# Patient Record
Sex: Female | Born: 1935 | Race: White | Hispanic: No
Health system: Southern US, Community
[De-identification: ages and names within clinical notes are randomized; demographics above are authoritative.]

---

## 2004-10-01 ENCOUNTER — Ambulatory Visit: Payer: Self-pay | Admitting: Internal Medicine

## 2005-03-30 ENCOUNTER — Other Ambulatory Visit: Payer: Self-pay

## 2005-04-01 ENCOUNTER — Inpatient Hospital Stay: Payer: Self-pay | Admitting: Internal Medicine

## 2005-10-13 ENCOUNTER — Ambulatory Visit: Payer: Self-pay | Admitting: Internal Medicine

## 2006-07-08 ENCOUNTER — Ambulatory Visit: Payer: Self-pay | Admitting: Neurology

## 2006-12-05 ENCOUNTER — Ambulatory Visit: Payer: Self-pay | Admitting: Internal Medicine

## 2007-01-19 IMAGING — CT CT HEAD WITHOUT AND WITH CONTRAST
1 of 2 series · 13 of 30 positions shown, 17 images · non-contrast
Comparison: none

REASON FOR EXAM: Memory loss
COMMENTS:

[Series 2: without · axial · non-contrast · 0.44mm/px · z∈[-169,-44]mm · 13 of 31 slices shown, 17 images]
[im 3/31  brain]
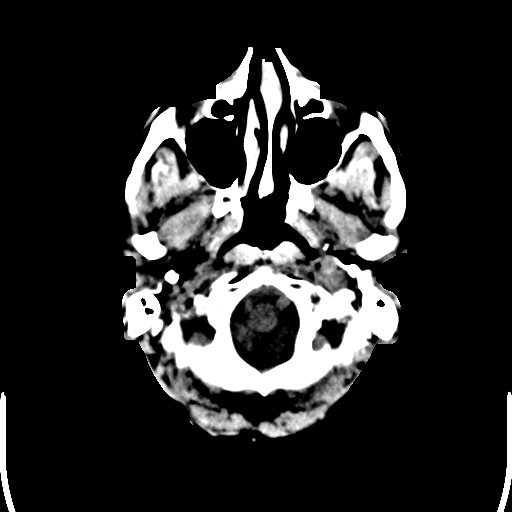
[im 3/31  bone]
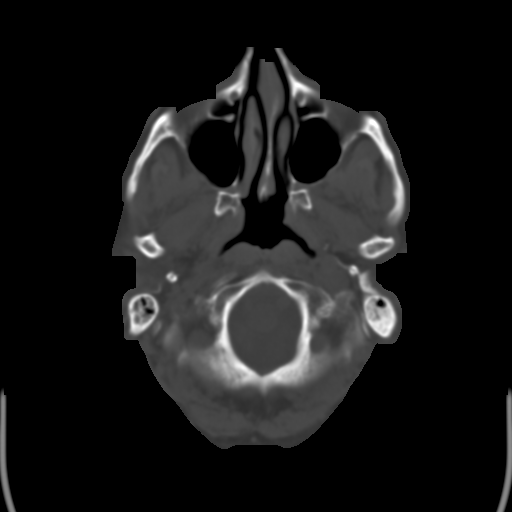
[im 5/31  brain]
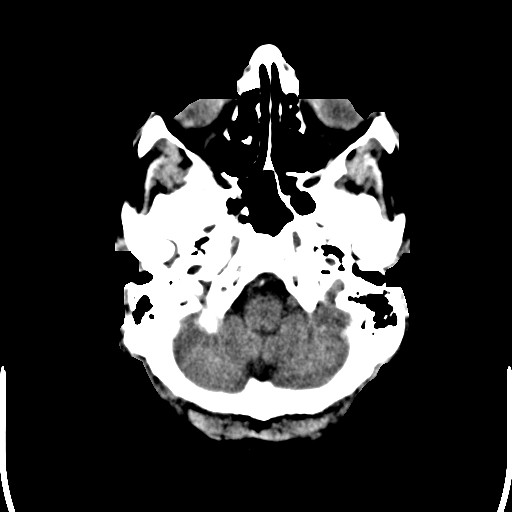
[im 7/31  brain]
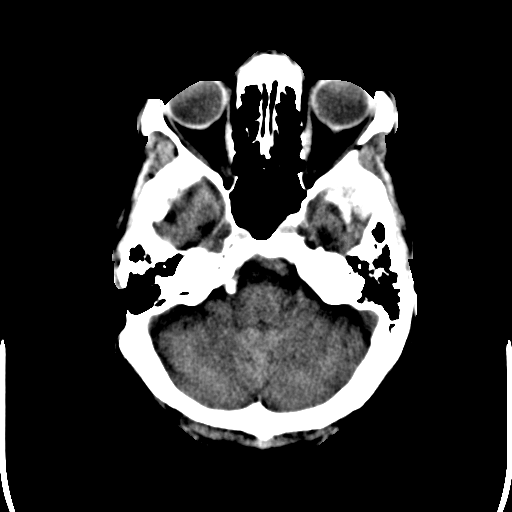
[im 9/31  brain]
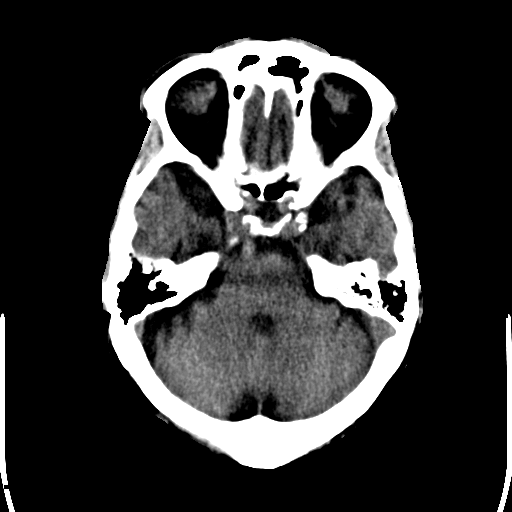
[im 11/31  brain]
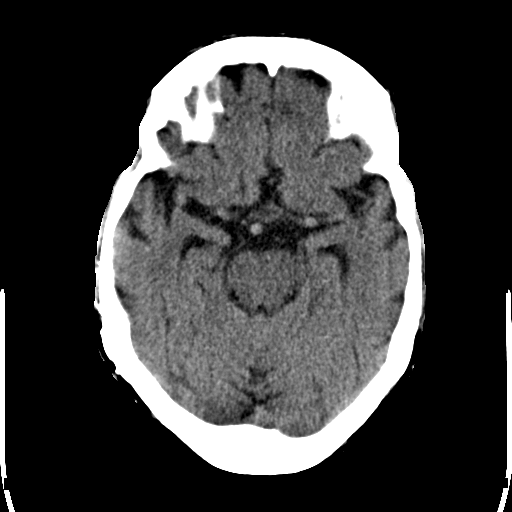
[im 11/31  bone]
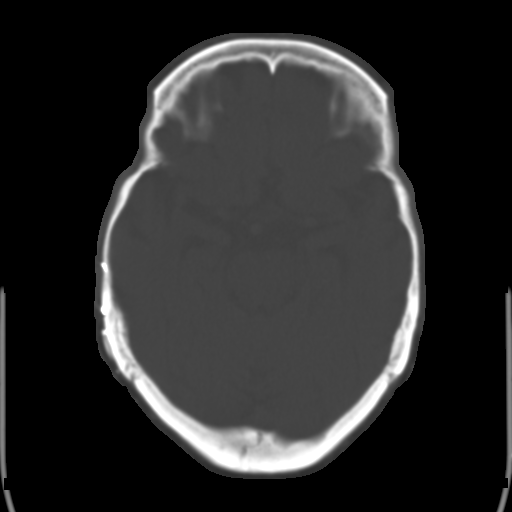
[im 13/31  brain]
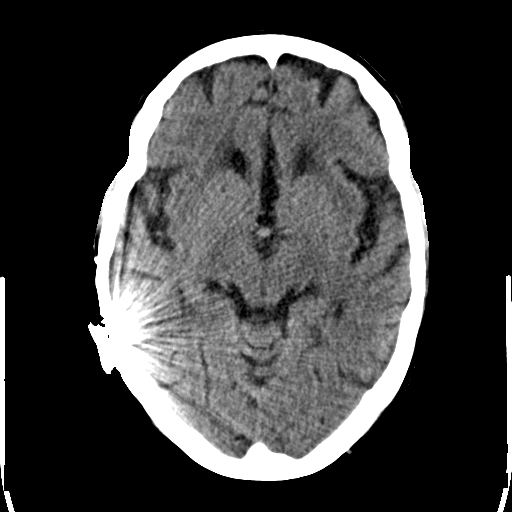
[im 16/31  brain]
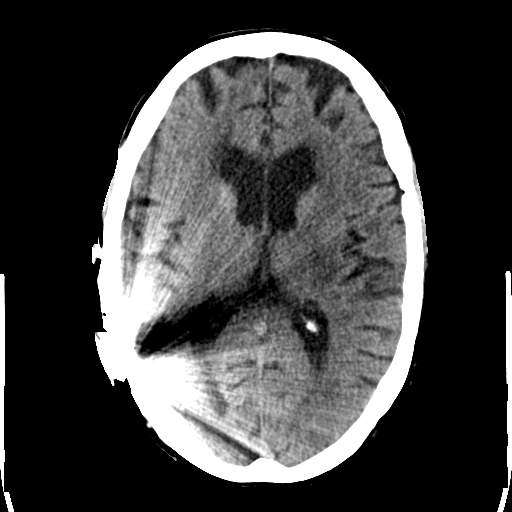
[im 18/31  brain]
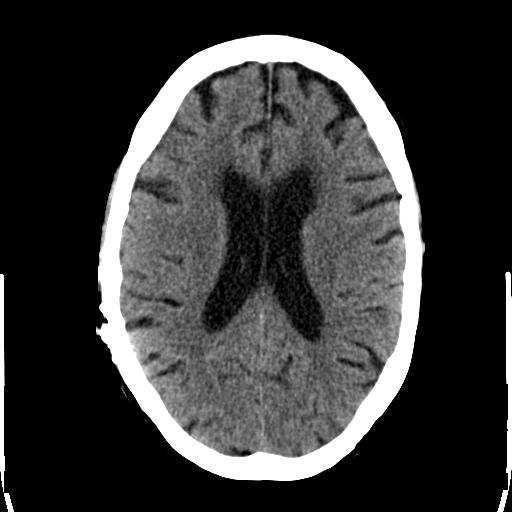
[im 20/31  brain]
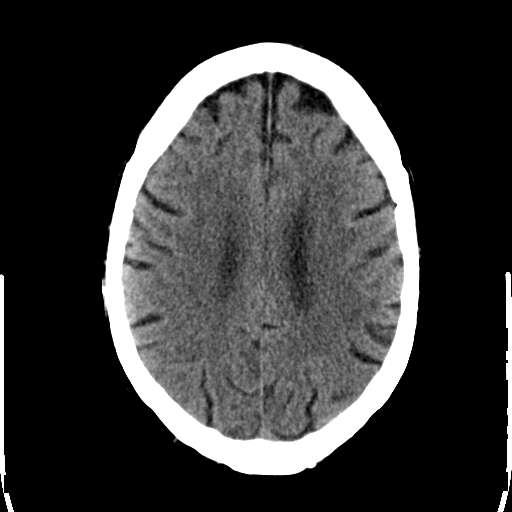
[im 20/31  bone]
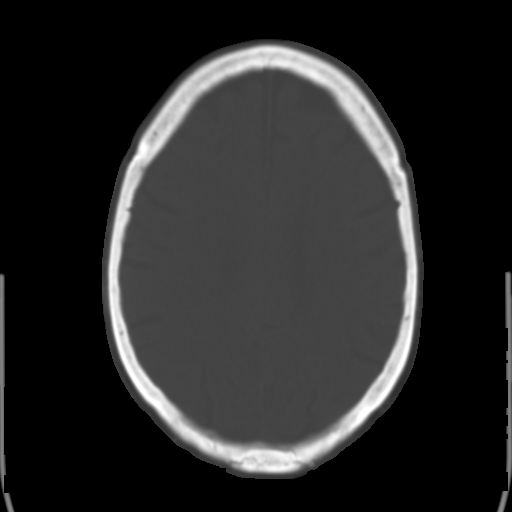
[im 22/31  brain]
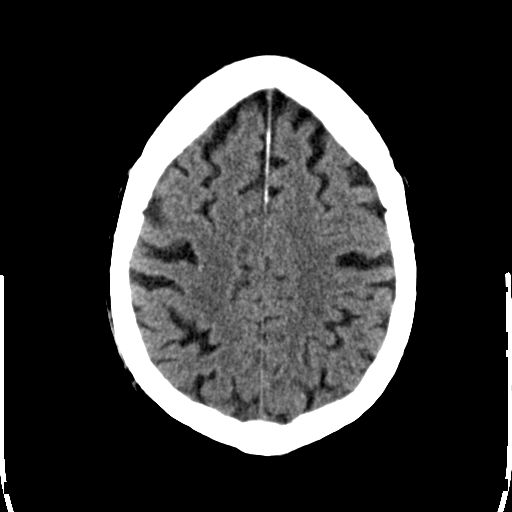
[im 24/31  brain]
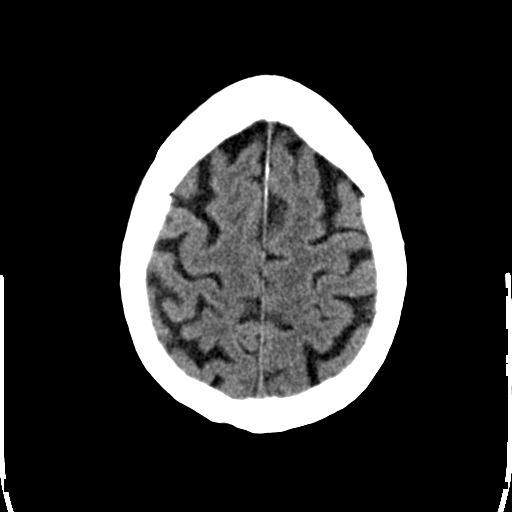
[im 26/31  brain]
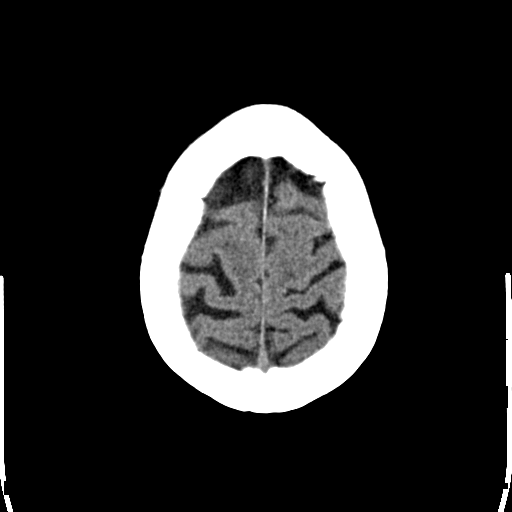
[im 28/31  brain]
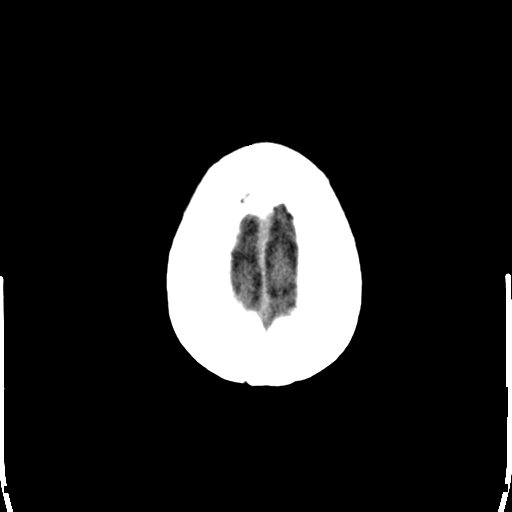
[im 28/31  bone]
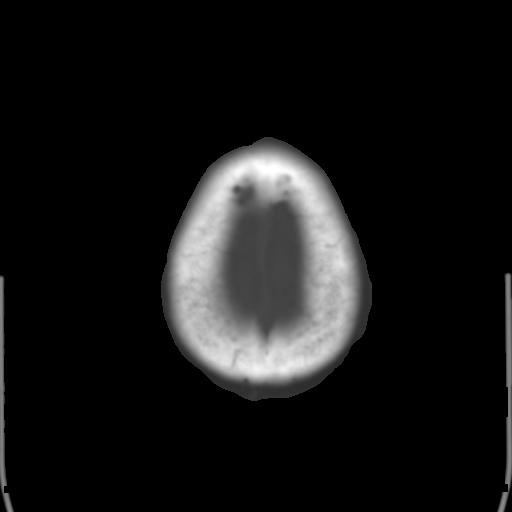

[13 of 30 positions shown; findings below may reference images not displayed]

PROCEDURE:     CT  - CT HEAD W/WO  - July 08, 2006  [DATE]

RESULT:     Pre and post contrast CT scan of the brain is performed.  The
patient has a cochlear implant and a history of breast cancer.  The patient
has a prior head CT from 07/09/03 for comparison.  This is not available on
the PACS system and the films are in remote storage.  They can be compared
if desired.

There is artifact from the cochlear implant over the RIGHT side of the
skull.  The ventricles and sulci appear to be within normal limits.  There
is no evidence of hemorrhage.  There is no mass effect or midline shift.
There is no extraaxial hemorrhage evident.  Posterior fossa appears to be
unremarkable.  The midbrain and brainstem are within normal limits.  There
appears to be some ill-defined low density in the periventricular white
matter most consistent with chronic microvascular ischemic disease.  No
definite territorial infarct is present.

Following the intravenous administration of iodinated contrast there is no
evidence of an enhancing mass or abnormal enhancement.  Bone window images
show no focal lesion.
IMPRESSION: Cochlear implant device over the RIGHT side of the scalp with wires seen
over the cochlear region and through the mastoid air cells.

Changes of diffuse atrophy and chronic microvascular ischemic disease.  No
focal masses.  No definite infarct seen.

## 2007-06-27 ENCOUNTER — Other Ambulatory Visit: Payer: Self-pay

## 2007-06-27 ENCOUNTER — Observation Stay: Payer: Self-pay | Admitting: Internal Medicine

## 2007-06-29 ENCOUNTER — Inpatient Hospital Stay: Payer: Self-pay | Admitting: Internal Medicine

## 2008-01-10 ENCOUNTER — Ambulatory Visit: Payer: Self-pay | Admitting: Internal Medicine

## 2009-01-10 ENCOUNTER — Ambulatory Visit: Payer: Self-pay | Admitting: Internal Medicine

## 2009-01-16 ENCOUNTER — Ambulatory Visit: Payer: Self-pay | Admitting: Internal Medicine

## 2009-07-21 ENCOUNTER — Ambulatory Visit: Payer: Self-pay | Admitting: Internal Medicine

## 2010-10-27 ENCOUNTER — Ambulatory Visit: Payer: Self-pay

## 2011-04-08 DIAGNOSIS — F411 Generalized anxiety disorder: Secondary | ICD-10-CM

## 2011-04-08 DIAGNOSIS — I10 Essential (primary) hypertension: Secondary | ICD-10-CM

## 2011-04-08 DIAGNOSIS — F0280 Dementia in other diseases classified elsewhere without behavioral disturbance: Secondary | ICD-10-CM

## 2011-04-08 DIAGNOSIS — G309 Alzheimer's disease, unspecified: Secondary | ICD-10-CM

## 2011-04-08 DIAGNOSIS — D649 Anemia, unspecified: Secondary | ICD-10-CM

## 2011-05-20 DIAGNOSIS — I10 Essential (primary) hypertension: Secondary | ICD-10-CM

## 2011-05-20 DIAGNOSIS — G309 Alzheimer's disease, unspecified: Secondary | ICD-10-CM

## 2011-05-20 DIAGNOSIS — F0280 Dementia in other diseases classified elsewhere without behavioral disturbance: Secondary | ICD-10-CM

## 2011-05-20 DIAGNOSIS — D649 Anemia, unspecified: Secondary | ICD-10-CM

## 2011-05-20 DIAGNOSIS — F411 Generalized anxiety disorder: Secondary | ICD-10-CM

## 2011-06-17 DIAGNOSIS — D649 Anemia, unspecified: Secondary | ICD-10-CM

## 2011-06-17 DIAGNOSIS — G309 Alzheimer's disease, unspecified: Secondary | ICD-10-CM

## 2011-06-17 DIAGNOSIS — F0280 Dementia in other diseases classified elsewhere without behavioral disturbance: Secondary | ICD-10-CM

## 2011-06-17 DIAGNOSIS — I10 Essential (primary) hypertension: Secondary | ICD-10-CM

## 2011-06-17 DIAGNOSIS — F411 Generalized anxiety disorder: Secondary | ICD-10-CM

## 2011-07-08 DIAGNOSIS — D649 Anemia, unspecified: Secondary | ICD-10-CM

## 2011-07-08 DIAGNOSIS — I10 Essential (primary) hypertension: Secondary | ICD-10-CM

## 2011-07-08 DIAGNOSIS — G309 Alzheimer's disease, unspecified: Secondary | ICD-10-CM

## 2011-07-08 DIAGNOSIS — F411 Generalized anxiety disorder: Secondary | ICD-10-CM

## 2011-07-08 DIAGNOSIS — F0280 Dementia in other diseases classified elsewhere without behavioral disturbance: Secondary | ICD-10-CM

## 2011-07-14 ENCOUNTER — Ambulatory Visit: Payer: Self-pay | Admitting: Internal Medicine

## 2011-09-13 DIAGNOSIS — F411 Generalized anxiety disorder: Secondary | ICD-10-CM

## 2011-09-13 DIAGNOSIS — F0280 Dementia in other diseases classified elsewhere without behavioral disturbance: Secondary | ICD-10-CM

## 2011-09-13 DIAGNOSIS — D649 Anemia, unspecified: Secondary | ICD-10-CM

## 2011-09-13 DIAGNOSIS — I10 Essential (primary) hypertension: Secondary | ICD-10-CM

## 2011-09-13 DIAGNOSIS — G309 Alzheimer's disease, unspecified: Secondary | ICD-10-CM

## 2011-11-04 DIAGNOSIS — G309 Alzheimer's disease, unspecified: Secondary | ICD-10-CM

## 2011-11-04 DIAGNOSIS — F411 Generalized anxiety disorder: Secondary | ICD-10-CM

## 2011-11-04 DIAGNOSIS — D649 Anemia, unspecified: Secondary | ICD-10-CM

## 2011-11-04 DIAGNOSIS — F0280 Dementia in other diseases classified elsewhere without behavioral disturbance: Secondary | ICD-10-CM

## 2011-11-04 DIAGNOSIS — I10 Essential (primary) hypertension: Secondary | ICD-10-CM

## 2011-11-24 DIAGNOSIS — R609 Edema, unspecified: Secondary | ICD-10-CM

## 2011-11-25 ENCOUNTER — Telehealth: Payer: Self-pay | Admitting: *Deleted

## 2011-11-25 ENCOUNTER — Ambulatory Visit: Payer: Self-pay | Admitting: Internal Medicine

## 2011-11-25 NOTE — Telephone Encounter (Signed)
Call Report:  Ultrasound of left leg negative for DVT, patient was sent home.

## 2011-11-25 NOTE — Telephone Encounter (Signed)
Darl Pikes RN at memory care notified

## 2012-01-03 DIAGNOSIS — M159 Polyosteoarthritis, unspecified: Secondary | ICD-10-CM

## 2012-01-03 DIAGNOSIS — F0280 Dementia in other diseases classified elsewhere without behavioral disturbance: Secondary | ICD-10-CM

## 2012-01-03 DIAGNOSIS — I10 Essential (primary) hypertension: Secondary | ICD-10-CM

## 2012-01-03 DIAGNOSIS — F411 Generalized anxiety disorder: Secondary | ICD-10-CM

## 2012-01-03 DIAGNOSIS — G309 Alzheimer's disease, unspecified: Secondary | ICD-10-CM

## 2012-03-08 DIAGNOSIS — F329 Major depressive disorder, single episode, unspecified: Secondary | ICD-10-CM

## 2012-03-16 DIAGNOSIS — M159 Polyosteoarthritis, unspecified: Secondary | ICD-10-CM

## 2012-03-16 DIAGNOSIS — F0280 Dementia in other diseases classified elsewhere without behavioral disturbance: Secondary | ICD-10-CM

## 2012-03-16 DIAGNOSIS — I10 Essential (primary) hypertension: Secondary | ICD-10-CM

## 2012-03-16 DIAGNOSIS — G309 Alzheimer's disease, unspecified: Secondary | ICD-10-CM

## 2012-03-16 DIAGNOSIS — F411 Generalized anxiety disorder: Secondary | ICD-10-CM

## 2012-05-15 DIAGNOSIS — F411 Generalized anxiety disorder: Secondary | ICD-10-CM

## 2012-05-15 DIAGNOSIS — F0280 Dementia in other diseases classified elsewhere without behavioral disturbance: Secondary | ICD-10-CM

## 2012-05-15 DIAGNOSIS — I10 Essential (primary) hypertension: Secondary | ICD-10-CM

## 2012-05-15 DIAGNOSIS — M159 Polyosteoarthritis, unspecified: Secondary | ICD-10-CM

## 2012-05-15 DIAGNOSIS — G309 Alzheimer's disease, unspecified: Secondary | ICD-10-CM

## 2012-06-29 DIAGNOSIS — F0280 Dementia in other diseases classified elsewhere without behavioral disturbance: Secondary | ICD-10-CM

## 2012-06-29 DIAGNOSIS — F411 Generalized anxiety disorder: Secondary | ICD-10-CM

## 2012-06-29 DIAGNOSIS — G309 Alzheimer's disease, unspecified: Secondary | ICD-10-CM

## 2012-06-29 DIAGNOSIS — M159 Polyosteoarthritis, unspecified: Secondary | ICD-10-CM

## 2012-06-29 DIAGNOSIS — I10 Essential (primary) hypertension: Secondary | ICD-10-CM

## 2012-09-11 DIAGNOSIS — G309 Alzheimer's disease, unspecified: Secondary | ICD-10-CM

## 2012-09-11 DIAGNOSIS — I10 Essential (primary) hypertension: Secondary | ICD-10-CM

## 2012-09-11 DIAGNOSIS — M159 Polyosteoarthritis, unspecified: Secondary | ICD-10-CM

## 2012-09-11 DIAGNOSIS — F411 Generalized anxiety disorder: Secondary | ICD-10-CM

## 2012-09-11 DIAGNOSIS — F0280 Dementia in other diseases classified elsewhere without behavioral disturbance: Secondary | ICD-10-CM

## 2012-10-06 ENCOUNTER — Telehealth: Payer: Self-pay | Admitting: Internal Medicine

## 2012-10-06 NOTE — Telephone Encounter (Signed)
Dr Dayton Martes will be there this AM

## 2012-10-06 NOTE — Telephone Encounter (Signed)
[facility] Triage Call Report Triage Record Num: [id number] Operator: Kelle Darting Patient Name: Megan Hoffman Call Date & Time: 10/05/2012 7:07:56PM Patient Phone: [phone] PCP: Tillman Abide Patient Gender: Female PCP Fax : [phone] Patient DOB: [birth year] [facility] Name[facility] Reason for Call: Caller: Teresa/RN at [facility]; PCP: Tillman Abide ; CB#: [phone]; Call regarding Wheezing; Afebrile; Onset: 10/05/12 at 1830; Sx notes: Patient is at the end of a cold and has just developed an expiratory wheeze after having a coughing spasm tonight after dinner, respirations were up and her heart rate was up; now nurse states that her heart rate and her respirations are down, now just sound junky but still has a little expiratory wheeze; Robatussin PRN, looked like patient was getting better, has had this cough for the past week or so per staff nurse; Vitals: Temp. 98.9; Heart Rate: 84; Respirations: 21; Blood Pressure: 119/71; Oxygen Saturation 93% on room air; Guideline used: Cough; Disposition: See provider within 72 hours due to had viral illness with a cough within last month that has not responded to home care; Spoke with Dr. Annia Friendly, informed of patient and orders given for Albuterol Neb Treatment every 4-6 hours as needed over night, also a one view chest film, have patient evaluated tomorrow; Teresa/RN informed of same; Note to office. Protocol(s) Used: Cough - Adult Recommended Outcome per Protocol: See Provider within 72 Hours Reason for Outcome: Had viral illness with a cough within last month that has not responded to home care Care Advice: ~ 10/05/2012 7:53:48PM Page 1 of 1 [email or web address]_[email or web address]

## 2012-11-02 DIAGNOSIS — F411 Generalized anxiety disorder: Secondary | ICD-10-CM

## 2012-11-02 DIAGNOSIS — R609 Edema, unspecified: Secondary | ICD-10-CM

## 2012-11-02 DIAGNOSIS — F0280 Dementia in other diseases classified elsewhere without behavioral disturbance: Secondary | ICD-10-CM

## 2012-11-02 DIAGNOSIS — G309 Alzheimer's disease, unspecified: Secondary | ICD-10-CM

## 2012-11-02 DIAGNOSIS — I10 Essential (primary) hypertension: Secondary | ICD-10-CM

## 2013-01-04 DIAGNOSIS — F411 Generalized anxiety disorder: Secondary | ICD-10-CM

## 2013-01-04 DIAGNOSIS — G309 Alzheimer's disease, unspecified: Secondary | ICD-10-CM

## 2013-01-04 DIAGNOSIS — F0280 Dementia in other diseases classified elsewhere without behavioral disturbance: Secondary | ICD-10-CM

## 2013-01-04 DIAGNOSIS — G479 Sleep disorder, unspecified: Secondary | ICD-10-CM

## 2013-01-04 DIAGNOSIS — I10 Essential (primary) hypertension: Secondary | ICD-10-CM

## 2013-02-08 DIAGNOSIS — D0460 Carcinoma in situ of skin of unspecified upper limb, including shoulder: Secondary | ICD-10-CM

## 2013-03-08 DIAGNOSIS — I10 Essential (primary) hypertension: Secondary | ICD-10-CM

## 2013-03-08 DIAGNOSIS — F411 Generalized anxiety disorder: Secondary | ICD-10-CM

## 2013-03-08 DIAGNOSIS — I872 Venous insufficiency (chronic) (peripheral): Secondary | ICD-10-CM

## 2013-03-08 DIAGNOSIS — G309 Alzheimer's disease, unspecified: Secondary | ICD-10-CM

## 2013-03-08 DIAGNOSIS — F0280 Dementia in other diseases classified elsewhere without behavioral disturbance: Secondary | ICD-10-CM

## 2013-05-01 DIAGNOSIS — F411 Generalized anxiety disorder: Secondary | ICD-10-CM

## 2013-05-14 DIAGNOSIS — F0280 Dementia in other diseases classified elsewhere without behavioral disturbance: Secondary | ICD-10-CM

## 2013-05-14 DIAGNOSIS — I10 Essential (primary) hypertension: Secondary | ICD-10-CM

## 2013-05-14 DIAGNOSIS — I872 Venous insufficiency (chronic) (peripheral): Secondary | ICD-10-CM

## 2013-05-14 DIAGNOSIS — F411 Generalized anxiety disorder: Secondary | ICD-10-CM

## 2013-05-14 DIAGNOSIS — G309 Alzheimer's disease, unspecified: Secondary | ICD-10-CM

## 2013-06-06 DIAGNOSIS — F99 Mental disorder, not otherwise specified: Secondary | ICD-10-CM

## 2013-06-06 DIAGNOSIS — R05 Cough: Secondary | ICD-10-CM

## 2013-06-06 DIAGNOSIS — R059 Cough, unspecified: Secondary | ICD-10-CM

## 2013-06-06 DIAGNOSIS — R509 Fever, unspecified: Secondary | ICD-10-CM

## 2013-07-11 DIAGNOSIS — G309 Alzheimer's disease, unspecified: Secondary | ICD-10-CM

## 2013-07-11 DIAGNOSIS — I10 Essential (primary) hypertension: Secondary | ICD-10-CM

## 2013-07-11 DIAGNOSIS — F411 Generalized anxiety disorder: Secondary | ICD-10-CM

## 2013-07-11 DIAGNOSIS — M1710 Unilateral primary osteoarthritis, unspecified knee: Secondary | ICD-10-CM

## 2013-07-11 DIAGNOSIS — F0280 Dementia in other diseases classified elsewhere without behavioral disturbance: Secondary | ICD-10-CM

## 2013-08-06 DIAGNOSIS — IMO0002 Reserved for concepts with insufficient information to code with codable children: Secondary | ICD-10-CM

## 2013-08-08 ENCOUNTER — Telehealth: Payer: Self-pay | Admitting: Internal Medicine

## 2013-08-08 DIAGNOSIS — I509 Heart failure, unspecified: Secondary | ICD-10-CM

## 2013-08-08 DIAGNOSIS — R609 Edema, unspecified: Secondary | ICD-10-CM

## 2013-08-08 NOTE — Telephone Encounter (Signed)
Caller: [name]/LPN; PCP: Tillman Abide (Family Practice); CB#: [phone]; [facility] center Resident.  Patient had Shortness of breath yesterday and X-Ray was ordered.  Call regarding Had a chest x-ray done.  Needs to report findings. REPORT SHOWS- lung hypoaerated , heart is mildly enlarged, large hiatel hernia, mild perihilar interstiisal prominence is evident. no effusion, clips presents Left axillary region, IMPRESSION= MILD CARDIOMELGALY, HYPOAERATION, MOST LIKELY REFLECTIVE OF HYPOAERATION WITH HIATLE HERNIA.  Marland Kitchen She is fine at rest and had a spell of rapid breathing. +wheezing which is not knew for patient but is not wheezing now. Legs are swollen and slightly more than normal 'balloon" . Weight patient only weighed once a month  October 196, Novemeber 199. She has orders for PRN respiratory treatment.  Getting Lasix 40 mg /HCTZ 25mg  daily.  VSS - Temp 98.6, p89  133/76, Pulse ox 95% RR 24 . Spoke with Dr. Drue Novel. patient should be evaluated tomorrow,mointor tonight, please contact for respiratory distress. mointor pulse ox and breathing. Contact the office in the morning for appt /evaluation. Given nebulizer treatment as needed. Information provided to [facility] Nurse. Understanding expressed.

## 2013-08-09 ENCOUNTER — Telehealth: Payer: Self-pay | Admitting: Family Medicine

## 2013-08-09 NOTE — Telephone Encounter (Signed)
Was seen yesterday and got extra lasix dose Better today

## 2013-08-09 NOTE — Telephone Encounter (Signed)
[facility] Triage Call Report Triage Record Num: [id number] Operator: Cornell Barman Patient Name: Megan Hoffman Call Date & Time: 08/08/2013 6:02:00PM Patient Phone: [phone] PCP: Tillman Abide Patient Gender: Female PCP Fax : [phone] Patient DOB: [birth year] [facility] Name[facility] Reason for Call: Caller: [name]/LPN; PCP: Tillman Abide (Family Practice); CB#: [phone]; [facility] center Resident. Patient had Shortness of breath yesterday and X-Ray was ordered. Call regarding Had a chest x-ray done. Needs to report findings. REPORT SHOWS- lung hypoaerated , heart is mildly enlarged, large hiatel hernia, mild perihilar interstiisal prominence is evident. no effusion, clips presents Left axillary region, IMPRESSION= MILD CARDIOMELGALY, HYPOAERATION, MOST LIKELY REFLECTIVE OF HYPOAERATION WITH HIATLE HERNIA. Marland Kitchen She is fine at rest and had a spell of rapid breathing. +wheezing which is not knew for patient but is not wheezing now. Legs are swollen and slightly more than normal 'balloon" . Weight patient only weighed once a month October 196, Novemeber 199. She has orders for PRN respiratory treatment. Getting Lasix 40 mg /HCTZ 25mg  daily. VSS - Temp 98.6, p89 133/76, Pulse ox 95% RR 24 . Spoke with Dr. Drue Novel. patient should be evaluated tomorrow,mointor tonight, please contact for respiratory distress. mointor pulse ox and breathing. Contact the office in the morning for appt /evaluation. Given nebulizer treatment as needed. Information provided to [facility] Nurse. Understanding expressed. Protocol(s) Used: Office Note Recommended Outcome per Protocol: Information Noted and Sent to Office Reason for Outcome: Caller information to office Care Advice: ~ 08/08/2013 6:33:00PM Page 1 of 1 CAN_TriageRpt_V2

## 2013-09-11 DIAGNOSIS — I10 Essential (primary) hypertension: Secondary | ICD-10-CM

## 2013-09-11 DIAGNOSIS — F0280 Dementia in other diseases classified elsewhere without behavioral disturbance: Secondary | ICD-10-CM

## 2013-09-11 DIAGNOSIS — F411 Generalized anxiety disorder: Secondary | ICD-10-CM

## 2013-09-11 DIAGNOSIS — M1710 Unilateral primary osteoarthritis, unspecified knee: Secondary | ICD-10-CM

## 2013-09-11 DIAGNOSIS — G309 Alzheimer's disease, unspecified: Secondary | ICD-10-CM

## 2013-10-22 ENCOUNTER — Telehealth: Payer: Self-pay

## 2013-10-22 NOTE — Telephone Encounter (Signed)
She was assessed this AM Doing better today

## 2013-10-22 NOTE — Telephone Encounter (Signed)
Triage Record Num: [id number] Operator: Kelle Dartingelenia Bringle Patient Name: Megan Hoffman Call Date & Time: 10/19/2013 11:58:33PM Patient Phone: [phone]) [phone] PCP: Tillman Abideichard Letvak Patient Gender: Female PCP Fax : [phone] Patient DOB: 10/30/1922 [facility] Reason for Call: Caller: Bonnie/RN at [facility]; PCP: Tillman AbideLetvak , Richard [facility](Family Practice); CB#: [phone]) [phone]; Call regarding Need orders; Temp. 99.5 (orally) after Tylenol; Onset: 10/19/13; Sx notes: Cough today with wheezing; Resp. illness in facility going around; Pt. had a fever of 101.5 (orally) at 2100, Tylenol 1 gm given, also 2nd shift nurse noticed wheezing (pt. has a hx of same), Albuterol Neb was given with some what improvement, Oxygen Saturation 93%, Room Air after Neb treatment, breathing is non-labored; earlier in the day, Oxygen saturation was at 88%, staff had pt. sit up and cough, saturation increased to 92%; At this time pt. is resting with no distress noted, no SOB noted at this time but does get SOB when up and about; Vitals: Blood pressure: 110/68; Resp. 21; Pulse: 79; Code Status: DNR; Guideline used: Cough; Disposition: Call provider Immediately due to any temp. elevation in a frail elderly person; Standing orders used for: SOB and febrile: Start oxygen at 2 liters per minute per nasal cannula; Fever orders (without UTI sx); Temp. less than 104.5, then may order: CBC, BMP, CXR, U/A, Urine C&S. Start Cipro 500mg  PO BID X 10 days. Rocephin was not used due to unknown reaction to PCN, pt. is allergic to PCN; RN voiced understanding of all; Note to office. Protocol(s) Used: Cough - Adult Recommended Outcome per Protocol: Call Provider Immediately Reason for Outcome: Any temperature elevation in an immunocompromised individual or a frail elderly person Care Advice: ~

## 2013-11-14 DIAGNOSIS — G309 Alzheimer's disease, unspecified: Secondary | ICD-10-CM

## 2013-11-14 DIAGNOSIS — F0280 Dementia in other diseases classified elsewhere without behavioral disturbance: Secondary | ICD-10-CM

## 2013-11-14 DIAGNOSIS — IMO0002 Reserved for concepts with insufficient information to code with codable children: Secondary | ICD-10-CM

## 2013-11-14 DIAGNOSIS — I10 Essential (primary) hypertension: Secondary | ICD-10-CM

## 2013-11-14 DIAGNOSIS — M1710 Unilateral primary osteoarthritis, unspecified knee: Secondary | ICD-10-CM

## 2013-11-14 DIAGNOSIS — F411 Generalized anxiety disorder: Secondary | ICD-10-CM

## 2014-01-01 DIAGNOSIS — F0280 Dementia in other diseases classified elsewhere without behavioral disturbance: Secondary | ICD-10-CM

## 2014-01-01 DIAGNOSIS — M1710 Unilateral primary osteoarthritis, unspecified knee: Secondary | ICD-10-CM

## 2014-01-01 DIAGNOSIS — I10 Essential (primary) hypertension: Secondary | ICD-10-CM

## 2014-01-01 DIAGNOSIS — IMO0002 Reserved for concepts with insufficient information to code with codable children: Secondary | ICD-10-CM

## 2014-01-01 DIAGNOSIS — F409 Phobic anxiety disorder, unspecified: Secondary | ICD-10-CM

## 2014-01-01 DIAGNOSIS — G309 Alzheimer's disease, unspecified: Secondary | ICD-10-CM

## 2014-03-12 DIAGNOSIS — G309 Alzheimer's disease, unspecified: Secondary | ICD-10-CM

## 2014-03-12 DIAGNOSIS — F411 Generalized anxiety disorder: Secondary | ICD-10-CM

## 2014-03-12 DIAGNOSIS — F0280 Dementia in other diseases classified elsewhere without behavioral disturbance: Secondary | ICD-10-CM

## 2014-03-12 DIAGNOSIS — IMO0002 Reserved for concepts with insufficient information to code with codable children: Secondary | ICD-10-CM

## 2014-03-12 DIAGNOSIS — I10 Essential (primary) hypertension: Secondary | ICD-10-CM

## 2014-03-12 DIAGNOSIS — R609 Edema, unspecified: Secondary | ICD-10-CM

## 2014-03-12 DIAGNOSIS — M1710 Unilateral primary osteoarthritis, unspecified knee: Secondary | ICD-10-CM

## 2014-05-09 ENCOUNTER — Inpatient Hospital Stay: Payer: Self-pay | Admitting: Internal Medicine

## 2014-05-09 LAB — BASIC METABOLIC PANEL
ANION GAP: 8 (ref 7–16)
BUN: 19 mg/dL — ABNORMAL HIGH (ref 7–18)
CALCIUM: 9.2 mg/dL (ref 8.5–10.1)
Chloride: 97 mmol/L — ABNORMAL LOW (ref 98–107)
Co2: 31 mmol/L (ref 21–32)
Creatinine: 1.18 mg/dL (ref 0.60–1.30)
EGFR (Non-African Amer.): 41 — ABNORMAL LOW
GFR CALC AF AMER: 47 — AB
GLUCOSE: 156 mg/dL — AB (ref 65–99)
OSMOLALITY: 277 (ref 275–301)
Potassium: 3 mmol/L — ABNORMAL LOW (ref 3.5–5.1)
SODIUM: 136 mmol/L (ref 136–145)

## 2014-05-09 LAB — URINALYSIS, COMPLETE
Bacteria: NONE SEEN
Bilirubin,UR: NEGATIVE
Glucose,UR: NEGATIVE mg/dL (ref 0–75)
KETONE: NEGATIVE
Leukocyte Esterase: NEGATIVE
Nitrite: NEGATIVE
PROTEIN: NEGATIVE
Ph: 5 (ref 4.5–8.0)
RBC,UR: 6 /HPF (ref 0–5)
Specific Gravity: 1.01 (ref 1.003–1.030)
Squamous Epithelial: NONE SEEN
WBC UR: <1 /HPF (ref 0–5)

## 2014-05-09 LAB — CBC
HCT: 35.2 % (ref 35.0–47.0)
HGB: 10.9 g/dL — ABNORMAL LOW (ref 12.0–16.0)
MCH: 23 pg — AB (ref 26.0–34.0)
MCHC: 30.9 g/dL — ABNORMAL LOW (ref 32.0–36.0)
MCV: 75 fL — AB (ref 80–100)
PLATELETS: 324 10*3/uL (ref 150–440)
RBC: 4.72 10*6/uL (ref 3.80–5.20)
RDW: 16.6 % — ABNORMAL HIGH (ref 11.5–14.5)
WBC: 11.6 10*3/uL — ABNORMAL HIGH (ref 3.6–11.0)

## 2014-05-10 LAB — BASIC METABOLIC PANEL
ANION GAP: 8 (ref 7–16)
BUN: 14 mg/dL (ref 7–18)
CHLORIDE: 101 mmol/L (ref 98–107)
Calcium, Total: 8.6 mg/dL (ref 8.5–10.1)
Co2: 31 mmol/L (ref 21–32)
Creatinine: 1 mg/dL (ref 0.60–1.30)
EGFR (Non-African Amer.): 50 — ABNORMAL LOW
GFR CALC AF AMER: 58 — AB
Glucose: 122 mg/dL — ABNORMAL HIGH (ref 65–99)
OSMOLALITY: 281 (ref 275–301)
POTASSIUM: 4.1 mmol/L (ref 3.5–5.1)
Sodium: 140 mmol/L (ref 136–145)

## 2014-05-10 LAB — CBC WITH DIFFERENTIAL/PLATELET
BASOS ABS: 0 10*3/uL (ref 0.0–0.1)
BASOS PCT: 0.5 %
Eosinophil #: 0.1 10*3/uL (ref 0.0–0.7)
Eosinophil %: 0.7 %
HCT: 32.1 % — AB (ref 35.0–47.0)
HGB: 10 g/dL — ABNORMAL LOW (ref 12.0–16.0)
LYMPHS PCT: 22.7 %
Lymphocyte #: 1.7 10*3/uL (ref 1.0–3.6)
MCH: 23.3 pg — ABNORMAL LOW (ref 26.0–34.0)
MCHC: 31 g/dL — AB (ref 32.0–36.0)
MCV: 75 fL — ABNORMAL LOW (ref 80–100)
Monocyte #: 0.9 x10 3/mm (ref 0.2–0.9)
Monocyte %: 12.4 %
Neutrophil #: 4.8 10*3/uL (ref 1.4–6.5)
Neutrophil %: 63.7 %
Platelet: 294 10*3/uL (ref 150–440)
RBC: 4.28 10*6/uL (ref 3.80–5.20)
RDW: 16.7 % — AB (ref 11.5–14.5)
WBC: 7.5 10*3/uL (ref 3.6–11.0)

## 2014-05-10 LAB — MAGNESIUM: Magnesium: 1.8 mg/dL

## 2014-05-11 LAB — BASIC METABOLIC PANEL
ANION GAP: 10 (ref 7–16)
BUN: 11 mg/dL (ref 7–18)
CHLORIDE: 105 mmol/L (ref 98–107)
Calcium, Total: 8.3 mg/dL — ABNORMAL LOW (ref 8.5–10.1)
Co2: 27 mmol/L (ref 21–32)
Creatinine: 0.86 mg/dL (ref 0.60–1.30)
EGFR (Non-African Amer.): 60 — ABNORMAL LOW
Glucose: 140 mg/dL — ABNORMAL HIGH (ref 65–99)
Osmolality: 285 (ref 275–301)
Potassium: 4.2 mmol/L (ref 3.5–5.1)
SODIUM: 142 mmol/L (ref 136–145)

## 2014-05-11 LAB — PLATELET COUNT: Platelet: 267 10*3/uL (ref 150–440)

## 2014-05-11 LAB — URINE CULTURE

## 2014-05-11 LAB — HEMOGLOBIN: HGB: 9.6 g/dL — ABNORMAL LOW (ref 12.0–16.0)

## 2014-05-12 LAB — BASIC METABOLIC PANEL
ANION GAP: 4 — AB (ref 7–16)
BUN: 12 mg/dL (ref 7–18)
CALCIUM: 8.3 mg/dL — AB (ref 8.5–10.1)
Chloride: 106 mmol/L (ref 98–107)
Co2: 28 mmol/L (ref 21–32)
Creatinine: 0.88 mg/dL (ref 0.60–1.30)
EGFR (African American): >60
GFR CALC NON AF AMER: 58 — AB
Glucose: 148 mg/dL — ABNORMAL HIGH (ref 65–99)
OSMOLALITY: 278 (ref 275–301)
POTASSIUM: 4 mmol/L (ref 3.5–5.1)
SODIUM: 138 mmol/L (ref 136–145)

## 2014-05-12 LAB — CBC WITH DIFFERENTIAL/PLATELET
Basophil #: 0 10*3/uL (ref 0.0–0.1)
Basophil %: 0.5 %
EOS ABS: 0 10*3/uL (ref 0.0–0.7)
EOS PCT: 0.1 %
HCT: 27.8 % — ABNORMAL LOW (ref 35.0–47.0)
HGB: 8.8 g/dL — ABNORMAL LOW (ref 12.0–16.0)
LYMPHS PCT: 11.1 %
Lymphocyte #: 0.8 10*3/uL — ABNORMAL LOW (ref 1.0–3.6)
MCH: 23.8 pg — AB (ref 26.0–34.0)
MCHC: 31.6 g/dL — ABNORMAL LOW (ref 32.0–36.0)
MCV: 75 fL — AB (ref 80–100)
Monocyte #: 0.7 x10 3/mm (ref 0.2–0.9)
Monocyte %: 9 %
NEUTROS ABS: 6 10*3/uL (ref 1.4–6.5)
Neutrophil %: 79.3 %
Platelet: 238 10*3/uL (ref 150–440)
RBC: 3.7 10*6/uL — ABNORMAL LOW (ref 3.80–5.20)
RDW: 16.6 % — AB (ref 11.5–14.5)
WBC: 7.5 10*3/uL (ref 3.6–11.0)

## 2014-05-13 LAB — CBC WITH DIFFERENTIAL/PLATELET
BASOS PCT: 0.4 %
Basophil #: 0 10*3/uL (ref 0.0–0.1)
EOS PCT: 0.9 %
Eosinophil #: 0.1 10*3/uL (ref 0.0–0.7)
HCT: 27.1 % — AB (ref 35.0–47.0)
HGB: 8.3 g/dL — AB (ref 12.0–16.0)
LYMPHS PCT: 14.9 %
Lymphocyte #: 1.2 10*3/uL (ref 1.0–3.6)
MCH: 23.1 pg — AB (ref 26.0–34.0)
MCHC: 30.8 g/dL — AB (ref 32.0–36.0)
MCV: 75 fL — AB (ref 80–100)
MONO ABS: 0.9 x10 3/mm (ref 0.2–0.9)
MONOS PCT: 11.9 %
NEUTROS ABS: 5.7 10*3/uL (ref 1.4–6.5)
Neutrophil %: 71.9 %
Platelet: 248 10*3/uL (ref 150–440)
RBC: 3.6 10*6/uL — ABNORMAL LOW (ref 3.80–5.20)
RDW: 17.1 % — ABNORMAL HIGH (ref 11.5–14.5)
WBC: 7.9 10*3/uL (ref 3.6–11.0)

## 2014-05-14 LAB — CBC WITH DIFFERENTIAL/PLATELET
BASOS ABS: 0.1 10*3/uL (ref 0.0–0.1)
Basophil %: 0.6 %
Eosinophil #: 0.1 10*3/uL (ref 0.0–0.7)
Eosinophil %: 1.4 %
HCT: 29 % — ABNORMAL LOW (ref 35.0–47.0)
HGB: 9.3 g/dL — ABNORMAL LOW (ref 12.0–16.0)
LYMPHS PCT: 22.9 %
Lymphocyte #: 2.1 10*3/uL (ref 1.0–3.6)
MCH: 24 pg — AB (ref 26.0–34.0)
MCHC: 32.1 g/dL (ref 32.0–36.0)
MCV: 75 fL — ABNORMAL LOW (ref 80–100)
MONO ABS: 1 x10 3/mm — AB (ref 0.2–0.9)
Monocyte %: 11.5 %
NEUTROS PCT: 63.6 %
Neutrophil #: 5.7 10*3/uL (ref 1.4–6.5)
Platelet: 290 10*3/uL (ref 150–440)
RBC: 3.88 10*6/uL (ref 3.80–5.20)
RDW: 17.3 % — ABNORMAL HIGH (ref 11.5–14.5)
WBC: 9 10*3/uL (ref 3.6–11.0)

## 2014-05-14 LAB — BASIC METABOLIC PANEL
ANION GAP: 11 (ref 7–16)
BUN: 13 mg/dL (ref 7–18)
CALCIUM: 8.6 mg/dL (ref 8.5–10.1)
CHLORIDE: 101 mmol/L (ref 98–107)
Co2: 32 mmol/L (ref 21–32)
Creatinine: 0.78 mg/dL (ref 0.60–1.30)
EGFR (Non-African Amer.): >60
GLUCOSE: 122 mg/dL — AB (ref 65–99)
Osmolality: 288 (ref 275–301)
Potassium: 3.4 mmol/L — ABNORMAL LOW (ref 3.5–5.1)
SODIUM: 144 mmol/L (ref 136–145)

## 2014-05-15 LAB — BASIC METABOLIC PANEL
Anion Gap: 10 (ref 7–16)
BUN: 16 mg/dL (ref 7–18)
CALCIUM: 8.9 mg/dL (ref 8.5–10.1)
CO2: 32 mmol/L (ref 21–32)
Chloride: 101 mmol/L (ref 98–107)
Creatinine: 0.89 mg/dL (ref 0.60–1.30)
EGFR (African American): >60
EGFR (Non-African Amer.): 57 — ABNORMAL LOW
Glucose: 150 mg/dL — ABNORMAL HIGH (ref 65–99)
Osmolality: 289 (ref 275–301)
Potassium: 3.4 mmol/L — ABNORMAL LOW (ref 3.5–5.1)
SODIUM: 143 mmol/L (ref 136–145)

## 2014-05-15 LAB — CBC WITH DIFFERENTIAL/PLATELET
BASOS ABS: 0 10*3/uL (ref 0.0–0.1)
BASOS PCT: 0.5 %
Eosinophil #: 0.2 10*3/uL (ref 0.0–0.7)
Eosinophil %: 1.8 %
HCT: 31.2 % — ABNORMAL LOW (ref 35.0–47.0)
HGB: 9.7 g/dL — AB (ref 12.0–16.0)
Lymphocyte #: 1.5 10*3/uL (ref 1.0–3.6)
Lymphocyte %: 16.3 %
MCH: 23.1 pg — ABNORMAL LOW (ref 26.0–34.0)
MCHC: 31 g/dL — ABNORMAL LOW (ref 32.0–36.0)
MCV: 75 fL — AB (ref 80–100)
MONOS PCT: 12.2 %
Monocyte #: 1.1 x10 3/mm — ABNORMAL HIGH (ref 0.2–0.9)
Neutrophil #: 6.3 10*3/uL (ref 1.4–6.5)
Neutrophil %: 69.2 %
PLATELETS: 290 10*3/uL (ref 150–440)
RBC: 4.19 10*6/uL (ref 3.80–5.20)
RDW: 17.4 % — AB (ref 11.5–14.5)
WBC: 9 10*3/uL (ref 3.6–11.0)

## 2014-05-16 DIAGNOSIS — D649 Anemia, unspecified: Secondary | ICD-10-CM

## 2014-05-16 DIAGNOSIS — S42309A Unspecified fracture of shaft of humerus, unspecified arm, initial encounter for closed fracture: Secondary | ICD-10-CM

## 2014-05-16 DIAGNOSIS — J9600 Acute respiratory failure, unspecified whether with hypoxia or hypercapnia: Secondary | ICD-10-CM

## 2014-05-28 DEATH — deceased

## 2015-01-18 NOTE — H&P (Signed)
PATIENT NAME:  Megan Hoffman, Megan Hoffman MR#:  [id number] DATE OF BIRTH:  05-03-1923  DATE OF ADMISSION:  05/09/2014  REQUESTING PHYSICIAN: Dr. Governor Rooksebecca Lord.   CHIEF COMPLAINT: Fall and right hand pain.   HISTORY OF PRESENT ILLNESS: The patient is an 81 year old female with a known history of Alzheimer's dementia, deafness, and hypertension who is being admitted status post fall with pain in her right arm. She was found to have diaphyseal fracture of the distal right humerus, for which she is being admitted. Her potassium was found to be 3.0. She is not able to provide any history. She has deafness and does not have any hearing.  While I was in the ED, I did discuss her case with her son while in the ED and he is agreeable with the plan.   PAST MEDICAL HISTORY:  1.  Alzheimer's dementia.  2.  Anxiety.  3.  Hypertension.  4.  Anemia of chronic disease.  5.  Deafness.  6.  History of breast cancer status post left modified radical mastectomy.  7.  History of diverticulosis.  8.  Iron deficiency anemia.   PAST SURGICAL HISTORY:  1.  Cochlear implant for hearing loss.  2.  Ventral wall hernia repair.  3.  Total abdominal hysterectomy and bilateral salpingo-oophorectomy.  4.  Left modified radical mastectomy.  5.  Partial colectomy and colostomy, subsequent reversal for diverticulosis.   ALLERGIES: PENICILLIN AND CODEINE.   SOCIAL HISTORY: No alcohol or tobacco. [facility] is a resident at [facility].   FAMILY HISTORY: Father had heart disease but died in his 3790s due to old age.    MEDICATIONS AT HOME:  1.  ABC vitamins once daily.  2.  Acetaminophen 1000 mg p.o. at bedtime.  3.  Tylenol 650 mg p.o. b.i.d.  4.  Albuterol inhalation every 4-6 hours as needed.  5.  Ativan 5 mg p.o. b.i.d.  6.  Calcium with vitamin D 1 tablet p.o. daily.  7.  Citalopram 20 mg p.o. at bedtime. 8.  Hydrochlorothiazide 25 mg p.o. daily.  9.  Lasix 80 mg p.o. daily.  10.  Lorazepam 0.5 mg p.o. 3 times a day as needed.   11.  Milk of magnesia 30 mL p.o. b.i.d. as needed.  12.  MiraLax every other day. 13.  Potassium chloride 20 mEq 1-1/2 tablets p.o. daily.  14.  Trazodone 50 mg p.o. at bedtime.  15.  Tylenol 1000 mg p.o. every 4 hours as needed.   REVIEW OF SYSTEMS: Unobtainable as the patient has deafness.   PHYSICAL EXAMINATION:  VITAL SIGNS: Temperature 98.5, heart rate 87 per minute, respirations 20 per minute, blood pressure 137/68 mmHg, saturating 92% on room air.  GENERAL: The patient is a 81 year old female lying in the bed comfortably without any acute distress.  EYES: Pupils equal, round, and reactive to light and accommodation. No scleral icterus. Extraocular muscles intact.  HEENT: Head atraumatic, normocephalic. Oropharynx and nasopharynx clear.  NECK: Supple. No jugular venous distention. No thyroid. No thyroid enlargement or tenderness.  LUNGS: Clear to auscultation bilaterally. No wheezing, rales, rhonchi or crepitation.  CARDIOVASCULAR: S1, S2, normal.  A 3/6 systolic ejection murmur present at the base radiating to carotids in the apical area.  ABDOMEN: Soft, nontender, nondistended. Bowel sounds present. No organomegaly or mass.  EXTREMITIES: No pedal edema, cyanosis, or clubbing.  NEUROLOGIC: Cranial nerves II-XII intact. Muscle strength 4/5 in all extremities. I did not check in the right upper extremity as she is somewhat in pain.  Sensation difficult to evaluate as she would not cooperate, but seems intact overall.  SKIN:  No obvious rash, lesion, or ulcer.  MUSCULOSKELETAL: No joint effusion or tenderness.   LABORATORY DATA: For pain on normal BMP except potassium of 3.0, chloride of 97. CBC within normal limits except white count of 11.6 and hemoglobin of 10.9.   Chest x-ray in the ED showed atelectasis at the bases.   Right humerus x-ray showed diaphyseal fracture of the distal right humerus.   EKG shows normal sinus rhythm, sinus tachycardia with a heart rate of 102 per minute  with first degree AV block. No major ST-T changes.   IMPRESSION AND PLAN:  1.  Preoperative medical clearance for surgery tomorrow. The patient is medically clear.  Should be at mild to moderate risk for planned surgery considering her advanced age, dementia, and hypertension.  2.  Hypokalemia.  We will replete and recheck. Check magnesium.  3.  Anxiety: Will continue her home medication, this is chronic. This could be the issue postoperatively as she could get more confused and agitated considering her advanced age and underlying dementia, also.  4.  Hypertension. We will continue her home medications.  5.  Diaphyseal fracture of the distal right humerus, status post fall.  We will consult orthopedics.  I have discussed the case with Dr. Elenor Legato nurse in the operating room to let him know that the patient  is cleared medically for planned surgery in the morning.   CODE STATUS: DO NOT RESUSCITATE.   TOTAL TIME TAKING CARE OF THIS PATIENT: 40 minutes.     ____________________________ Ellamae Sia. Sherryll Burger, MD vss:TT D: 05/09/2014 17:45:37 ET T: 05/09/2014 18:33:00 ET JOB#: [id number]  cc: Emaleigh Guimond S. Sherryll Burger, MD, <Dictator> Karie Schwalbe, MD Ellamae Sia [facility] MD ELECTRONICALLY SIGNED 05/10/2014 12:39

## 2015-01-18 NOTE — Consult Note (Signed)
Brief Consult Note: Diagnosis: Comminuted right distal humerus fracture.   Patient was seen by consultant.   Comments: Informed that patient's son would be here in the AM. RUE splint in good position. Good capillary refill. Neurologically intact, Severely HOH. Recommend ORIF. Will obtain consent tomorrow after discussing with patient's son.  Electronic Signatures: Donato HeinzHooten, James P (MD)  (Signed 13-Aug-15 22:48)  Authored: Brief Consult Note   Last Updated: 13-Aug-15 22:48 by Donato HeinzHooten, James P (MD)

## 2015-01-18 NOTE — Discharge Summary (Signed)
PATIENT NAME:  Megan Hoffman, Megan Hoffman MR#:  [id number] DATE OF BIRTH:  [birth year]  DATE OF ADMISSION:  05/09/2014 DATE OF DISCHARGE:  05/15/2014  ADMITTING DIAGNOSIS: Right humerus fracture.   ASSOCIATED DIAGNOSES:  1. Acute respiratory failure, improved at time of discharge.  2. Acute blood loss anemia.  3. Alzheimer's dementia with agitation.  4. Hypertension.  5. Gastroesophageal reflux disease.  6. Deafness with cochlear implant.  7. Anxiety.  8. Anemia of chronic disease.  9. History of breast cancer, status post left mastectomy.  10. Diverticulosis status post hemicolectomy.   CONSULTATIONS: Dr. Francesco SorJames Hooten, orthopedics. No other consultations.   PROCEDURES: Open reduction and internal fixation of the right distal humerus fracture performed on August 14 without complication.   HISTORY OF PRESENT ILLNESS: This 81 year old female with known history of Alzheimer dementia, deafness, hypertension, admitted status post fall with pain in the right arm. She was found to have a diaphyseal fracture of the  distal right humerus and was admitted for surgical correction.    HOSPITAL COURSE AND TREATMENT:  1. Right humerus fracture: The patient underwent ORIF of the right humerus on August 14 without complication. She has continued to make slow progress with physical therapy. Pain is controlled. She will follow up in orthopedic clinic 7 to 10 days after surgery. She will need to continue with physical therapy at her skilled nursing facility.  2. Acute respiratory failure with hypoxia: On the evening of August 15, the patient developed acute respiratory failure and required a nonrebreather for oxygen support. Chest x-ray at that time showed findings consistent with either pulmonary edema or pneumonia. She was treated briefly with Levaquin for possible pneumonia, but this was stopped due to there being no other clinical signs of pneumonia, no fever, no increase in white blood cell count. Pulmonary edema is  suspected as her home Lasix had been stopped upon admission. Home dose Lasix of 80 mg daily was restarted at that time with an additional 40 mg of IV Lasix with a good result. The patient was able to wean down to room air with intermittent need for 2 liters of nasal cannula mainly in the evening. She is discharged with 2 liters of oxygen by nasal cannula with need for skilled nursing monitoring of her respiratory status.  3. Acute blood loss anemia: There was no sign of overt blood loss after surgery. No unusual swelling of her arm. Some of the decrease in hemoglobin seen may be due to volume dilution as diuretics had been held. Initial hemoglobin was 10.9, which decreased to a low of 8.3, and increased back to 9.7 without transfusion. 4. Alzheimer dementia: The patient had agitation, mainly at night, especially in the evenings immediately after surgery. She is being discharged on her home regimen back to her former memory care unit.  5. Hypertension: Blood pressures have been stable in hospital on home regimen.  6. Gastroesophageal reflux disease: Continue PPI.  7. Deafness with a cochlear implant: The patient communicates best when writing questions and instructions.   DISCHARGE PHYSICAL EXAMINATION:  VITAL SIGNS: Day of discharge, temperature 98.3, pulse 77, respirations 18, blood pressure 134/73, oxygenation 95% on 2 liters.  GENERAL: The patient is alert, resting comfortably in the bed.  CARDIOVASCULAR: Irregular rate, murmur present. No lower extremity edema, no JVD. There is a 4/6 systolic ejection murmur.  RESPIRATORY: There are scattered fine crackles. Good air movement, no respiratory distress.  ABDOMEN: Bowel sounds are normal, abdomen is soft. She denies tenderness. There is no  guarding or rebound.  EXTREMITIES: There is a brace present on the right upper arm.  SKIN: Normal to palpation. No rash.  NEUROLOGIC: Cranial nerves intact with the exception of decreased hearing.  PSYCHIATRIC: She  is alert this morning. Responds to voice, communicates needs, communicates best with writing of questions.   LABORATORY DATA: Sodium 143, potassium 3.4, chloride 101, bicarbonate 32, BUN 16, creatinine 0.89, glucose 150. Hemoglobin 9.7, platelets 290,000, white blood cell count 9, MCV 75.   CONDITION ON DISCHARGE: The patient is in stable condition.   DISPOSITION: She is discharged back to skilled nursing memory care unit. She has increased need for physical therapy for rehabilitation of right humerus fracture and also respiratory care skilled monitoring of respiratory status. She is on 2 liters of nasal cannula with saturations in the mid 90s at this time. She has no respiratory distress.   DISCHARGE MEDICATIONS: 1. Albuterol 2.5 mg/3 mL inhalation solution inhaled every 4 to 6 hours as needed for shortness of breath or wheezing.  2. Senior therapeutic multivitamin 1 tablet orally once a day.  3. Hydrochlorothiazide 25 mg 1 tablet daily.  4. Calcium 1200 mg/vitamin D 200 units 1 tablet once a day.  5. Lasix 80 mg once a day.  6. Polyethylene glycol 3350 oral powder 17 grams orally every other day.  7. Citalopram 20 mg 1 tablet once a day.  8. Trazodone 50 mg 1 tablet once a day at bedtime.  9. Lorazepam 0.5 mg 1 tablet orally 3 times a day as needed for agitation.  10. Potassium chloride 20 mEq 1.5 tablets once a day in the evening.  11. Acetaminophen 500 mg 1 tablet every 4 hours as needed for pain.  12. Magnesium hydroxide 8% oral suspension 300 mL orally 2 times a day as needed for constipation.  13. Oxycodone 5 mg 1 tablet orally every 4 hours as needed for pain.  14. Tramadol 50 mg 1 tablet every 4 hours as needed for pain.  15. Bisacodyl 10 mg rectal suppository 1 suppository rectally once a day as needed for constipation.  16. Docusate-senna 50 mg-8.6 mg 1 tablet orally 2 times a day.  17. Pantoprazole 40 mg 1 tablet orally once a day.   DIET: Low sodium diet.   ACTIVITY: Per  physical therapy.   FOLLOW-UP: She is to follow up with orthopedics within 1 to 2 weeks of discharge.    ____________________________ Ena Dawley. Clent Ridges, MD cpw:JT D: 05/15/2014 09:00:11 ET T: 05/15/2014 09:44:24 ET JOB#: [id number]  cc: Santina Evans P. Clent Ridges, MD, <Dictator> Gale Journey MD ELECTRONICALLY SIGNED 05/23/2014 13:14

## 2015-01-18 NOTE — Op Note (Signed)
PATIENT NAME:  Megan HaymakerWOLFE, Melisssa E MR#:  [id number] DATE OF BIRTH:  04-09-1923  DATE OF PROCEDURE:  05/10/2014  PREOPERATIVE DIAGNOSIS: Comminuted right distal humerus fracture.   POSTOPERATIVE DIAGNOSIS: Comminuted right distal humerus fracture.   PROCEDURE PERFORMED: Open reduction and internal fixation of comminuted right distal humerus fracture.   SURGEON: Dr. Francesco SorJames Hooten.   ANESTHESIA: General.   ESTIMATED BLOOD LOSS: 200 mL.   FLUIDS REPLACED: 1500 mL of crystalloid.   TOURNIQUET TIME: 82 minutes.   DRAINS: None.  IMPLANTS UTILIZED: Biomet 17 hole right posterolateral plate, seven 3.5 mm locking screws and two 3.5 mm nonlocking screws.   INDICATIONS FOR SURGERY: The patient is an 81 year old left-hand dominant female with severe dementia who fell and sustained a comminuted right distal humerus fracture. After discussion of the risks and benefits of surgical intervention with the patient's son, he expressed understanding of the risks, benefits, and agreed with plans for surgical intervention.   PROCEDURE IN DETAIL: The patient was brought to the Operating Room and, after adequate general endotracheal anesthesia was achieved, the patient was placed in a left lateral decubitus position on the beanbag and the right arm was positioned over an armrest. The patient's right arm and hand were cleaned and prepped with alcohol and DuraPrep and draped in the usual sterile fashion. A "timeout" was performed as per usual protocol. Right upper extremity was exsanguinated using an Esmarch, and the tourniquet was inflated to 250 mmHg. A posterior longitudinal incision was made over the upper arm extending just lateral to the olecranon distally. A triceps splitting approach was utilized. Dissection was carried down to the fracture site distally. The dissection was carefully followed proximally and the radial nerve was identified and carefully retracted laterally. Inspection of the fracture demonstrated gross  comminution at the site without parent extension to the articular surface. Provisional reduction and fixation was performed using K wires. Bone reduction forceps were also used to approximate the distal fragments to the proximal portion. The shorter posterolateral plate did not allow for adequate fixation proximal to the fracture site. Tourniquet was subsequently deflated and removed. Again, careful dissection was carried out and a 17 hole posterolateral plate was placed on the cortex, taking care to place the plate under the radial nerve. Provisional fixation was placed distally using a K wire with good position noted using FluoroScan. A 3.5 mm nonlocking screw was placed through a slotted position in the distal portion of the plate to further secure the plate to the larger portion of the distal fragment. In a similar fashion, nonlocking 3.5 mm screw was used proximally. Again, good position was appreciated. Additional fixation was performed using a total of seven 3.5 mm locking screws. There was a long medial fragment, which could not be adequately secured with the plate, but otherwise good stabilization was appreciated. The wound was irrigated with copious amounts of normal saline with antibiotic solution. Good hemostasis was appreciated. The fascia overlying the triceps was repaired using #1 Vicryl. The subcutaneous tissue was approximated using first #0 Vicryl followed by #2-0 Vicryl. Skin was closed with skin staples. Sterile dressing was applied followed by application of a posterior splint.   The patient tolerated the procedure well. She was transported to the recovery room in stable condition.     ____________________________ Illene LabradorJames P. Angie FavaHooten Jr., MD jph:JT D: 05/11/2014 14:31:12 ET T: 05/12/2014 00:19:08 ET JOB#: [id number]  cc: Illene LabradorJames P. Angie FavaHooten Jr., MD, <Dictator> JAMES P Angie FavaHOOTEN JR MD ELECTRONICALLY SIGNED 05/13/2014 7:25
